# Patient Record
Sex: Male | Born: 1990 | Race: White | Hispanic: No | Marital: Single | Smoking: Never smoker
Health system: Southern US, Community
[De-identification: ages and names within clinical notes are randomized; demographics above are authoritative.]

## PROBLEM LIST (undated history)

## (undated) DIAGNOSIS — L988 Other specified disorders of the skin and subcutaneous tissue: Secondary | ICD-10-CM

## (undated) DIAGNOSIS — T7840XA Allergy, unspecified, initial encounter: Secondary | ICD-10-CM

## (undated) DIAGNOSIS — K219 Gastro-esophageal reflux disease without esophagitis: Secondary | ICD-10-CM

## (undated) HISTORY — DX: Allergy, unspecified, initial encounter: T78.40XA

---
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
## 2023-01-17 ENCOUNTER — Encounter
Admission: RE | Admit: 2023-01-17 | Discharge: 2023-01-17 | Disposition: A | Discharge status: Home / Self Care | Payer: BC Managed Care – PPO | Source: Ambulatory Visit | Attending: Surgery | Admitting: Surgery

## 2023-01-17 ENCOUNTER — Ambulatory Visit: Payer: Self-pay | Admitting: Surgery

## 2023-01-17 ENCOUNTER — Other Ambulatory Visit: Payer: Self-pay

## 2023-01-17 HISTORY — DX: Other specified disorders of the skin and subcutaneous tissue: L98.8

## 2023-01-17 HISTORY — DX: Gastro-esophageal reflux disease without esophagitis: K21.9

## 2023-01-17 NOTE — H&P (View-Only) (Signed)
Subjective:   CC: Pilonidal disease [L98.8]  HPI: Samuel Russell is a 31 y.o. male who was referred by Self Referral for evaluation of above. First noted several years ago. Recent drainage by derm. Recurrent for several months. Minimally symptomatic now.  Past Medical History: none  Past Surgical History: none  Family History: family history is not on file.  Social History: reports that he has been smoking cigarettes. He uses smokeless tobacco. Alcohol use questions deferred to the physician. He reports that he does not use drugs.  Current Medications: has a current medication list which includes the following prescription(s): fluticasone propionate and omeprazole.  Allergies:  No Known Allergies  ROS:  A 15 point review of systems was performed and pertinent positives and negatives noted in HPI  Objective:    BP (!) 150/104  Pulse 80  Ht 177.8 cm (5' 10")  Wt 99.8 kg (220 lb)  BMI 31.57 kg/m   Constitutional : No distress, cooperative, alert  Lymphatics/Throat: Supple with no lymphadenopathy  Respiratory: Clear to auscultation bilaterally  Cardiovascular: Regular rate and rhythm  Gastrointestinal: Soft, non-tender, non-distended, no organomegaly.  Musculoskeletal: Steady gait and movement  Skin: Cool and moist. Obvious pilonidal pits, chronic abscess noted on right aspect, but no obvious drainage or TTP today  Psychiatric: Normal affect, non-agitated, not confused     LABS:  N/a   RADS: N/a  Assessment:    Pilonidal disease [L98.8]  Plan:    1. Pilonidal disease [L98.8] Discussed surgical excision. Alternatives include continued observation. Benefits include possible symptom relief, pathologic evaluation, improved cosmesis. Discussed the risk of surgery including recurrence, chronic pain, post-op infxn, poor cosmesis, poor/delayed wound healing, and possible re-operation to address said risks. The risks of general anesthetic, if used, includes MI, CVA, sudden  death or even reaction to anesthetic medications also discussed.  Typical post-op recovery time of 3-5 days with possible activity restrictions were also discussed.  The patient verbalized understanding and all questions were answered to the patient's satisfaction.  2. Patient has elected to proceed with surgical treatment. Procedure will be scheduled when financially ready to do so.  labs/images/medications/previous chart entries reviewed personally and relevant changes/updates noted above.  

## 2023-01-17 NOTE — H&P (Signed)
Subjective:   CC: Pilonidal disease [L98.8]  HPI: Samuel Russell is a 32 y.o. male who was referred by Self Referral for evaluation of above. First noted several years ago. Recent drainage by derm. Recurrent for several months. Minimally symptomatic now.  Past Medical History: none  Past Surgical History: none  Family History: family history is not on file.  Social History: reports that he has been smoking cigarettes. He uses smokeless tobacco. Alcohol use questions deferred to the physician. He reports that he does not use drugs.  Current Medications: has a current medication list which includes the following prescription(s): fluticasone propionate and omeprazole.  Allergies:  No Known Allergies  ROS:  A 15 point review of systems was performed and pertinent positives and negatives noted in HPI  Objective:    BP (!) 150/104  Pulse 80  Ht 177.8 cm (5\' 10" )  Wt 99.8 kg (220 lb)  BMI 31.57 kg/m   Constitutional : No distress, cooperative, alert  Lymphatics/Throat: Supple with no lymphadenopathy  Respiratory: Clear to auscultation bilaterally  Cardiovascular: Regular rate and rhythm  Gastrointestinal: Soft, non-tender, non-distended, no organomegaly.  Musculoskeletal: Steady gait and movement  Skin: Cool and moist. Obvious pilonidal pits, chronic abscess noted on right aspect, but no obvious drainage or TTP today  Psychiatric: Normal affect, non-agitated, not confused     LABS:  N/a   RADS: N/a  Assessment:    Pilonidal disease [L98.8]  Plan:    1. Pilonidal disease [L98.8] Discussed surgical excision. Alternatives include continued observation. Benefits include possible symptom relief, pathologic evaluation, improved cosmesis. Discussed the risk of surgery including recurrence, chronic pain, post-op infxn, poor cosmesis, poor/delayed wound healing, and possible re-operation to address said risks. The risks of general anesthetic, if used, includes MI, CVA, sudden  death or even reaction to anesthetic medications also discussed.  Typical post-op recovery time of 3-5 days with possible activity restrictions were also discussed.  The patient verbalized understanding and all questions were answered to the patient's satisfaction.  2. Patient has elected to proceed with surgical treatment. Procedure will be scheduled when financially ready to do so.  labs/images/medications/previous chart entries reviewed personally and relevant changes/updates noted above.

## 2023-01-17 NOTE — Patient Instructions (Addendum)
Your procedure is scheduled on: 01/24/23 - Wednesday Report to the Registration Desk on the 1st floor of the [facility]. To find out your arrival time, please call [phone] between 1PM - 3PM on: 01/23/23 - Tuesday If your arrival time is 6:00 am, do not arrive before that time as the [facility] entrance doors do not open until 6:00 am.  REMEMBER: Instructions that are not followed completely may result in serious medical risk, up to and including death; or upon the discretion of your surgeon and anesthesiologist your surgery may need to be rescheduled.  Do not eat food after midnight the night before surgery.  No gum chewing or hard candies.  You may however, drink CLEAR liquids up to 2 hours before you are scheduled to arrive for your surgery. Do not drink anything within 2 hours of your scheduled arrival time.  Clear liquids include: - water  - apple juice without pulp - gatorade (not RED colors) - black coffee or tea (Do NOT add milk or creamers to the coffee or tea) Do NOT drink anything that is not on this list.  One week prior to surgery: Stop Anti-inflammatories (NSAIDS) such as Advil, Aleve, Ibuprofen, Motrin, Naproxen, Naprosyn and Aspirin based products such as Excedrin, Goody's Powder, BC Powder.  Stop ANY OVER THE COUNTER supplements until after surgery.  You may however, continue to take Tylenol if needed for pain up until the day of surgery.    TAKE ONLY THESE MEDICATIONS THE MORNING OF SURGERY WITH A SIP OF WATER:  omeprazole (PRILOSEC) - (take one the night before and one on the morning of surgery - helps to prevent nausea after surgery.)   No Alcohol for 24 hours before or after surgery.  No Smoking including e-cigarettes for 24 hours before surgery.  No chewable tobacco products for at least 6 hours before surgery.  No nicotine patches on the day of surgery.  Do not use any "recreational" drugs for at least a week (preferably 2 weeks) before your  surgery.  Please be advised that the combination of cocaine and anesthesia may have negative outcomes, up to and including death. If you test positive for cocaine, your surgery will be cancelled.  On the morning of surgery brush your teeth with toothpaste and water, you may rinse your mouth with mouthwash if you wish. Do not swallow any toothpaste or mouthwash.  Do not wear jewelry, make-up, hairpins, clips or nail polish.  Do not wear lotions, powders, or perfumes.   Do not shave body hair from the neck down 48 hours before surgery.  Contact lenses, hearing aids and dentures may not be worn into surgery.  Do not bring valuables to the hospital. [facility] is not responsible for any missing/lost belongings or valuables.   Notify your doctor if there is any change in your medical condition (cold, fever, infection).  Wear comfortable clothing (specific to your surgery type) to the hospital.  After surgery, you can help prevent lung complications by doing breathing exercises.  Take deep breaths and cough every 1-2 hours. Your doctor may order a device called an Incentive Spirometer to help you take deep breaths. When coughing or sneezing, hold a pillow firmly against your incision with both hands. This is called "splinting." Doing this helps protect your incision. It also decreases belly discomfort.  If you are being admitted to the hospital overnight, leave your suitcase in the car. After surgery it may be brought to your room.  In case of  increased patient census, it may be necessary for you, the patient, to continue your postoperative care in the Same Day Surgery department.  If you are being discharged the day of surgery, you will not be allowed to drive home. You will need a responsible individual to drive you home and stay with you for 24 hours after surgery.   If you are taking public transportation, you will need to have a responsible individual with you.  Please call the  Pre-admissions Testing Dept. at [phone] if you have any questions about these instructions.  Surgery Visitation Policy:  Patients having surgery or a procedure may have two visitors.  Children under the age of 27 must have an adult with them who is not the patient.  Inpatient Visitation:    Visiting hours are 7 a.m. to 8 p.m. Up to four visitors are allowed at one time in a patient room. The visitors may rotate out with other people during the day.  One visitor age 83 or older may stay with the patient overnight and must be in the room by 8 p.m.

## 2023-01-23 MED ORDER — ACETAMINOPHEN 500 MG PO TABS
1000.0000 mg | ORAL_TABLET | ORAL | Status: AC
  Administered 2023-01-24: 1000 mg via ORAL

## 2023-01-23 MED ORDER — CHLORHEXIDINE GLUCONATE CLOTH 2 % EX PADS
6.0000 | MEDICATED_PAD | Freq: Once | CUTANEOUS | Status: AC
  Administered 2023-01-24: 6 via TOPICAL

## 2023-01-23 MED ORDER — CHLORHEXIDINE GLUCONATE 0.12 % MT SOLN
15.0000 mL | Freq: Once | OROMUCOSAL | Status: AC
  Administered 2023-01-24: 15 mL via OROMUCOSAL

## 2023-01-23 MED ORDER — ORAL CARE MOUTH RINSE: 15.0000 mL | Freq: Once | OROMUCOSAL | Status: AC

## 2023-01-23 MED ORDER — LACTATED RINGERS IV SOLN: INTRAVENOUS | Status: DC

## 2023-01-23 MED ORDER — GABAPENTIN 300 MG PO CAPS
300.0000 mg | ORAL_CAPSULE | ORAL | Status: AC
  Administered 2023-01-24: 300 mg via ORAL

## 2023-01-23 MED ORDER — CELECOXIB 200 MG PO CAPS
200.0000 mg | ORAL_CAPSULE | ORAL | Status: AC
  Administered 2023-01-24: 200 mg via ORAL

## 2023-01-23 MED ORDER — CEFAZOLIN SODIUM-DEXTROSE 2-4 GM/100ML-% IV SOLN
2.0000 g | INTRAVENOUS | Status: AC
  Administered 2023-01-24: 2 g via INTRAVENOUS

## 2023-01-24 ENCOUNTER — Encounter: Payer: Self-pay | Admitting: Surgery

## 2023-01-24 ENCOUNTER — Ambulatory Visit
Admission: RE | Admit: 2023-01-24 | Discharge: 2023-01-24 | Disposition: A | Discharge status: Home / Self Care | Payer: BC Managed Care – PPO | Attending: Surgery | Admitting: Surgery

## 2023-01-24 ENCOUNTER — Other Ambulatory Visit: Payer: Self-pay

## 2023-01-24 ENCOUNTER — Ambulatory Visit: Payer: BC Managed Care – PPO | Admitting: Anesthesiology

## 2023-01-24 ENCOUNTER — Encounter
Admission: RE | Disposition: A | Discharge status: Home / Self Care | Payer: Self-pay | Source: Home / Self Care | Attending: Surgery

## 2023-01-24 DIAGNOSIS — L988 Other specified disorders of the skin and subcutaneous tissue: Secondary | ICD-10-CM | POA: Insufficient documentation

## 2023-01-24 DIAGNOSIS — F17210 Nicotine dependence, cigarettes, uncomplicated: Secondary | ICD-10-CM | POA: Diagnosis not present

## 2023-01-24 DIAGNOSIS — L0591 Pilonidal cyst without abscess: Secondary | ICD-10-CM | POA: Diagnosis not present

## 2023-01-24 HISTORY — PX: PILONIDAL CYST EXCISION: SHX744

## 2023-01-24 SURGERY — EXCISION, PILONIDAL CYST, EXTENSIVE
Anesthesia: General | Site: Buttocks

## 2023-01-24 MED ORDER — DEXMEDETOMIDINE HCL IN NACL 80 MCG/20ML IV SOLN
INTRAVENOUS | Status: DC | PRN
  Administered 2023-01-24: 8 ug via INTRAVENOUS
  Administered 2023-01-24: 12 ug via INTRAVENOUS

## 2023-01-24 MED ORDER — DOCUSATE SODIUM 100 MG PO CAPS: 100.0000 mg | ORAL_CAPSULE | Freq: Two times a day (BID) | ORAL | 0 refills | Status: AC | PRN

## 2023-01-24 MED ORDER — DEXMEDETOMIDINE HCL IN NACL 80 MCG/20ML IV SOLN
INTRAVENOUS | Status: AC
  Filled 2023-01-24: qty 20

## 2023-01-24 MED ORDER — CHLORHEXIDINE GLUCONATE 0.12 % MT SOLN
OROMUCOSAL | Status: AC
  Filled 2023-01-24: qty 15

## 2023-01-24 MED ORDER — DEXAMETHASONE SODIUM PHOSPHATE 10 MG/ML IJ SOLN
INTRAMUSCULAR | Status: DC | PRN
  Administered 2023-01-24: 5 mg via INTRAVENOUS

## 2023-01-24 MED ORDER — BUPIVACAINE HCL (PF) 0.5 % IJ SOLN
INTRAMUSCULAR | Status: AC
  Filled 2023-01-24: qty 30

## 2023-01-24 MED ORDER — EPINEPHRINE PF 1 MG/ML IJ SOLN
INTRAMUSCULAR | Status: AC
  Filled 2023-01-24: qty 1

## 2023-01-24 MED ORDER — MIDAZOLAM HCL 2 MG/2ML IJ SOLN
INTRAMUSCULAR | Status: AC
  Filled 2023-01-24: qty 2

## 2023-01-24 MED ORDER — FENTANYL CITRATE (PF) 100 MCG/2ML IJ SOLN
INTRAMUSCULAR | Status: AC
  Filled 2023-01-24: qty 2

## 2023-01-24 MED ORDER — LIDOCAINE HCL (CARDIAC) PF 100 MG/5ML IV SOSY
PREFILLED_SYRINGE | INTRAVENOUS | Status: DC | PRN
  Administered 2023-01-24: 100 mg via INTRAVENOUS

## 2023-01-24 MED ORDER — PROPOFOL 10 MG/ML IV BOLUS
INTRAVENOUS | Status: DC | PRN
  Administered 2023-01-24: 200 mg via INTRAVENOUS

## 2023-01-24 MED ORDER — ACETAMINOPHEN 325 MG PO TABS: 650.0000 mg | ORAL_TABLET | Freq: Three times a day (TID) | ORAL | 0 refills | Status: AC | PRN

## 2023-01-24 MED ORDER — ACETAMINOPHEN 500 MG PO TABS
ORAL_TABLET | ORAL | Status: AC
  Filled 2023-01-24: qty 2

## 2023-01-24 MED ORDER — PROPOFOL 10 MG/ML IV BOLUS
INTRAVENOUS | Status: AC
  Filled 2023-01-24: qty 20

## 2023-01-24 MED ORDER — SUGAMMADEX SODIUM 200 MG/2ML IV SOLN
INTRAVENOUS | Status: DC | PRN
  Administered 2023-01-24: 400 mg via INTRAVENOUS

## 2023-01-24 MED ORDER — BUPIVACAINE-EPINEPHRINE (PF) 0.5% -1:200000 IJ SOLN
INTRAMUSCULAR | Status: DC | PRN
  Administered 2023-01-24: 50 mL

## 2023-01-24 MED ORDER — IBUPROFEN 800 MG PO TABS: 800.0000 mg | ORAL_TABLET | Freq: Three times a day (TID) | ORAL | 0 refills | Status: AC | PRN

## 2023-01-24 MED ORDER — DROPERIDOL 2.5 MG/ML IJ SOLN: 0.6250 mg | Freq: Once | INTRAMUSCULAR | Status: DC | PRN

## 2023-01-24 MED ORDER — BUPIVACAINE LIPOSOME 1.3 % IJ SUSP
INTRAMUSCULAR | Status: AC
  Filled 2023-01-24: qty 20

## 2023-01-24 MED ORDER — CEFAZOLIN SODIUM-DEXTROSE 2-4 GM/100ML-% IV SOLN
INTRAVENOUS | Status: AC
  Filled 2023-01-24: qty 100

## 2023-01-24 MED ORDER — 0.9 % SODIUM CHLORIDE (POUR BTL) OPTIME
TOPICAL | Status: DC | PRN
  Administered 2023-01-24: 500 mL

## 2023-01-24 MED ORDER — OXYCODONE-ACETAMINOPHEN 5-325 MG PO TABS: 1.0000 | ORAL_TABLET | Freq: Three times a day (TID) | ORAL | 0 refills | Status: DC | PRN

## 2023-01-24 MED ORDER — FENTANYL CITRATE (PF) 100 MCG/2ML IJ SOLN: 25.0000 ug | INTRAMUSCULAR | Status: DC | PRN

## 2023-01-24 MED ORDER — MIDAZOLAM HCL 2 MG/2ML IJ SOLN
INTRAMUSCULAR | Status: DC | PRN
  Administered 2023-01-24: 2 mg via INTRAVENOUS

## 2023-01-24 MED ORDER — FENTANYL CITRATE (PF) 100 MCG/2ML IJ SOLN
INTRAMUSCULAR | Status: DC | PRN
  Administered 2023-01-24 (×2): 25 ug via INTRAVENOUS
  Administered 2023-01-24: 50 ug via INTRAVENOUS

## 2023-01-24 MED ORDER — ACETAMINOPHEN 10 MG/ML IV SOLN: 1000.0000 mg | Freq: Once | INTRAVENOUS | Status: DC | PRN

## 2023-01-24 MED ORDER — OXYCODONE HCL 5 MG PO TABS: 5.0000 mg | ORAL_TABLET | Freq: Once | ORAL | Status: DC | PRN

## 2023-01-24 MED ORDER — OXYCODONE HCL 5 MG/5ML PO SOLN: 5.0000 mg | Freq: Once | ORAL | Status: DC | PRN

## 2023-01-24 MED ORDER — CELECOXIB 200 MG PO CAPS
ORAL_CAPSULE | ORAL | Status: AC
  Filled 2023-01-24: qty 1

## 2023-01-24 MED ORDER — ONDANSETRON HCL 4 MG/2ML IJ SOLN
INTRAMUSCULAR | Status: DC | PRN
  Administered 2023-01-24: 4 mg via INTRAVENOUS

## 2023-01-24 MED ORDER — ALBUTEROL SULFATE HFA 108 (90 BASE) MCG/ACT IN AERS
INHALATION_SPRAY | RESPIRATORY_TRACT | Status: DC | PRN
  Administered 2023-01-24: 4 via RESPIRATORY_TRACT

## 2023-01-24 MED ORDER — PROMETHAZINE HCL 25 MG/ML IJ SOLN: 6.2500 mg | INTRAMUSCULAR | Status: DC | PRN

## 2023-01-24 MED ORDER — GABAPENTIN 300 MG PO CAPS
ORAL_CAPSULE | ORAL | Status: AC
  Filled 2023-01-24: qty 1

## 2023-01-24 MED ORDER — ROCURONIUM BROMIDE 100 MG/10ML IV SOLN
INTRAVENOUS | Status: DC | PRN
  Administered 2023-01-24: 70 mg via INTRAVENOUS

## 2023-01-24 SURGICAL SUPPLY — 36 items
ADH LQ OCL WTPRF AMP STRL LF (MISCELLANEOUS) ×1
ADHESIVE MASTISOL STRL (MISCELLANEOUS) ×1 IMPLANT
BLADE CLIPPER SURG (BLADE) ×1 IMPLANT
BLADE SURG SZ10 CARB STEEL (BLADE) ×1 IMPLANT
BRIEF MESH DISP 2XL (UNDERPADS AND DIAPERS) ×1 IMPLANT
BRUSH SCRUB EZ 4% CHG (MISCELLANEOUS) ×1 IMPLANT
DRAPE LAPAROTOMY 100X77 ABD (DRAPES) ×1 IMPLANT
ELECT REM PT RETURN 9FT ADLT (ELECTROSURGICAL) ×1
ELECTRODE REM PT RTRN 9FT ADLT (ELECTROSURGICAL) ×1 IMPLANT
GAUZE 4X4 16PLY ~~LOC~~+RFID DBL (SPONGE) ×1 IMPLANT
GAUZE SPONGE 4X4 12PLY STRL (GAUZE/BANDAGES/DRESSINGS) ×1 IMPLANT
GLOVE BIOGEL PI IND STRL 7.0 (GLOVE) ×1 IMPLANT
GLOVE SURG SYN 6.5 ES PF (GLOVE) ×1 IMPLANT
GLOVE SURG SYN 6.5 PF PI (GLOVE) ×1 IMPLANT
GOWN STRL REUS W/ TWL LRG LVL3 (GOWN DISPOSABLE) ×2 IMPLANT
GOWN STRL REUS W/TWL LRG LVL3 (GOWN DISPOSABLE) ×2
KIT TURNOVER KIT A (KITS) ×1 IMPLANT
MANIFOLD NEPTUNE II (INSTRUMENTS) ×1 IMPLANT
NDL HYPO 22X1.5 SAFETY MO (MISCELLANEOUS) ×1 IMPLANT
NEEDLE HYPO 22X1.5 SAFETY MO (MISCELLANEOUS) ×1 IMPLANT
NS IRRIG 500ML POUR BTL (IV SOLUTION) ×1 IMPLANT
PACK BASIN MINOR ARMC (MISCELLANEOUS) ×1 IMPLANT
PAD ABD DERMACEA PRESS 5X9 (GAUZE/BANDAGES/DRESSINGS) ×1 IMPLANT
SOL PREP PVP 2OZ (MISCELLANEOUS) ×1
SOLUTION PREP PVP 2OZ (MISCELLANEOUS) ×1 IMPLANT
SUT MNCRL 4-0 (SUTURE) ×1
SUT MNCRL 4-0 27XMFL (SUTURE) ×1
SUT VIC AB 3-0 SH 27 (SUTURE) ×1
SUT VIC AB 3-0 SH 27X BRD (SUTURE) ×1 IMPLANT
SUTURE MNCRL 4-0 27XMF (SUTURE) ×1 IMPLANT
SYR 10ML LL (SYRINGE) ×1 IMPLANT
SYR 20ML LL LF (SYRINGE) ×1 IMPLANT
SYR BULB IRRIG 60ML STRL (SYRINGE) ×1 IMPLANT
TAPE CLOTH 3X10 WHT NS LF (GAUZE/BANDAGES/DRESSINGS) ×1 IMPLANT
TRAP FLUID SMOKE EVACUATOR (MISCELLANEOUS) ×1 IMPLANT
WATER STERILE IRR 500ML POUR (IV SOLUTION) ×1 IMPLANT

## 2023-01-24 NOTE — Interval H&P Note (Signed)
No change. OK to proceed.

## 2023-01-24 NOTE — Op Note (Addendum)
Pre-Op Dx: pilonidal disease Post-Op Dx: same Anesthesia: GETA EBL: 20ml Complications:  none apparent Specimen: none Procedure: pilonidal cystectomy  Surgeon: Tonna Boehringer  Indications for procedure: See H&P  Description of Procedure:  Consent obtained, time out performed.  Patient placed in prone position.  Area sterilized and draped in usual position.  After infusion of local,elliptical incision created around the diseased midline portion along with the abscess on right lateral aspect, removed entirely down to subcutaneous tissue, passed off operative field.  All visible hair and chronic irritated tissue removed out of wound bed.   Wound hemostasis noted, exparel infused, and wound packed with betadine infused kerlex roll, secured in placed with 4x4 and abd pape, medipore tape. Total wound measurement 8cm x 4cm x 3cm deep  Pt tolerated procedure well, and transferred to PACU in stable condition. Sponge and instrument count correct at end of procedure.

## 2023-01-24 NOTE — Anesthesia Preprocedure Evaluation (Signed)
Anesthesia Evaluation  Patient identified by MRN, date of birth, ID band Patient awake    Reviewed: Allergy & Precautions, H&P , NPO status , Patient's Chart, lab work & pertinent test results, reviewed documented beta blocker date and time   Airway Mallampati: II  TM Distance: >3 FB Neck ROM: full    Dental  (+) Teeth Intact   Pulmonary neg pulmonary ROS   Pulmonary exam normal        Cardiovascular Exercise Tolerance: Good negative cardio ROS Normal cardiovascular exam Rhythm:regular Rate:Normal     Neuro/Psych negative neurological ROS  negative psych ROS   GI/Hepatic Neg liver ROS,GERD  Medicated,,  Endo/Other  negative endocrine ROS    Renal/GU negative Renal ROS  negative genitourinary   Musculoskeletal   Abdominal   Peds  Hematology negative hematology ROS (+)   Anesthesia Other Findings Past Medical History: No date: GERD (gastroesophageal reflux disease) No date: Pilonidal disease Past Surgical History: 2006: FRACTURE SURGERY     Comment:  reconstructive surgery on left leg due to sport injury BMI    Body Mass Index: 32.28 kg/m     Reproductive/Obstetrics negative OB ROS                             Anesthesia Physical Anesthesia Plan  ASA: 2  Anesthesia Plan: General ETT   Post-op Pain Management:    Induction:   PONV Risk Score and Plan: 3  Airway Management Planned:   Additional Equipment:   Intra-op Plan:   Post-operative Plan:   Informed Consent: I have reviewed the patients History and Physical, chart, labs and discussed the procedure including the risks, benefits and alternatives for the proposed anesthesia with the patient or authorized representative who has indicated his/her understanding and acceptance.     Dental Advisory Given  Plan Discussed with: CRNA  Anesthesia Plan Comments:        Anesthesia Quick Evaluation

## 2023-01-24 NOTE — Discharge Instructions (Addendum)
Removal, Care After This sheet gives you information about how to care for yourself after your procedure. Your health care provider may also give you more specific instructions. If you have problems or questions, contact your health care provider. What can I expect after the procedure? After the procedure, it is common to have: Soreness. Bruising. Itching. Follow these instructions at home: site care Follow instructions from your health care provider about how to take care of your site. Make sure you: Wash your hands with soap and water before and after you change your bandage (dressing). If soap and water are not available, use hand sanitizer. Leave stitches (sutures), skin glue, or adhesive strips in place. These skin closures may need to stay in place for 2 weeks or longer. If adhesive strip edges start to loosen and curl up, you may trim the loose edges. Do not remove adhesive strips completely unless your health care provider tells you to do that. If the area bleeds or bruises, apply gentle pressure for 10 minutes. NO shower until first dressing change in office IF PACKING FALLS OUT BEFORE FIRST VISIT, PACK THE WOUND WITH GAUZE SOAKED WITH PURIFIED WATER.  Check your site every day for signs of infection. Check for: Redness, swelling, or pain. Fluid or blood. Warmth. Pus or a bad smell.  General instructions Rest and then return to your normal activities as told by your health care provider.  tylenol and advil as needed for discomfort.  Please alternate between the two every four hours as needed for pain.    Use narcotics, if prescribed, only when tylenol and motrin is not enough to control pain.  325-650mg  every 8hrs to max of 3000mg /24hrs (including the 325mg  in every norco dose) for the tylenol.    Advil up to 800mg  per dose every 8hrs as needed for pain.   Keep all follow-up visits as told by your health care provider. This is important. Contact a health care provider if: You  have redness, swelling, or pain around your site. You have fluid or blood coming from your site. Your site feels warm to the touch. You have pus or a bad smell coming from your site. You have a fever. Your sutures, skin glue, or adhesive strips loosen or come off sooner than expected. Get help right away if: You have bleeding that does not stop with pressure or a dressing. Summary After the procedure, it is common to have some soreness, bruising, and itching at the site. Follow instructions from your health care provider about how to take care of your site. Check your site every day for signs of infection. Contact a health care provider if you have redness, swelling, or pain around your site, or your site feels warm to the touch. Keep all follow-up visits as told by your health care provider. This is important. This information is not intended to replace advice given to you by your health care provider. Make sure you discuss any questions you have with your health care provider. Document Released: 08/20/2015 Document Revised: 01/21/2018 Document Reviewed: 01/21/2018 Elsevier Interactive Patient Education  2019 [employer]   AMBULATORY SURGERY  DISCHARGE INSTRUCTIONS   The drugs that you were given will stay in your system until tomorrow so for the next 24 hours you should not:  Drive an automobile Make any legal decisions Drink any alcoholic beverage   You may resume regular meals tomorrow.  Today it is better to start with liquids and gradually work up to solid foods.  You may eat anything you prefer, but it is better to start with liquids, then soup and crackers, and gradually work up to solid foods.   Please notify your doctor immediately if you have any unusual bleeding, trouble breathing, redness and pain at the surgery site, drainage, fever, or pain not relieved by medication.     Your post-operative visit with Dr.                                       is: Date:                         Time:    Please call to schedule your post-operative visit.  Additional Instructions:

## 2023-01-24 NOTE — Anesthesia Procedure Notes (Signed)
Procedure Name: Intubation Date/Time: 01/24/2023 1:54 PM  Performed by: Danelle Berry, CRNAPre-anesthesia Checklist: Patient identified, Emergency Drugs available, Suction available and Patient being monitored Patient Re-evaluated:Patient Re-evaluated prior to induction Oxygen Delivery Method: Circle system utilized Preoxygenation: Pre-oxygenation with 100% oxygen Induction Type: IV induction Ventilation: Mask ventilation without difficulty Laryngoscope Size: McGraph and 3 Grade View: Grade II Tube type: Oral Tube size: 7.5 mm Number of attempts: 2 Airway Equipment and Method: Stylet and Oral airway Placement Confirmation: ETT inserted through vocal cords under direct vision, positive ETCO2 and breath sounds checked- equal and bilateral Secured at: 22 cm Tube secured with: Tape Dental Injury: Teeth and Oropharynx as per pre-operative assessment

## 2023-01-24 NOTE — Transfer of Care (Signed)
Immediate Anesthesia Transfer of Care Note  Patient: Samuel Russell  Procedure(s) Performed: CYST EXCISION PILONIDAL EXTENSIVE (Buttocks)  Patient Location: PACU  Anesthesia Type:General  Level of Consciousness: drowsy  Airway & Oxygen Therapy: Patient Spontanous Breathing and Patient connected to face mask oxygen  Post-op Assessment: Report given to RN and Post -op Vital signs reviewed and stable  Post vital signs: Reviewed and stable  Last Vitals:  Vitals Value Taken Time  BP 123/79 01/24/23 1345  Temp 36.2 C 01/24/23 1345  Pulse 78 01/24/23 1347  Resp 15 01/24/23 1347  SpO2 98 % 01/24/23 1347  Vitals shown include unvalidated device data.  Last Pain:  Vitals:   01/24/23 0850  TempSrc: Temporal  PainSc: 0-No pain         Complications: No notable events documented.

## 2023-01-25 ENCOUNTER — Encounter: Payer: Self-pay | Admitting: Surgery

## 2023-01-26 NOTE — Anesthesia Postprocedure Evaluation (Signed)
Anesthesia Post Note  Patient: Samuel Russell  Procedure(s) Performed: CYST EXCISION PILONIDAL EXTENSIVE (Buttocks)  Patient location during evaluation: PACU Anesthesia Type: General Level of consciousness: awake and alert Pain management: pain level controlled Vital Signs Assessment: post-procedure vital signs reviewed and stable Respiratory status: spontaneous breathing, nonlabored ventilation, respiratory function stable and patient connected to nasal cannula oxygen Cardiovascular status: blood pressure returned to baseline and stable Postop Assessment: no apparent nausea or vomiting Anesthetic complications: no   No notable events documented.   Last Vitals:  Vitals:   01/24/23 1430 01/24/23 1448  BP: 113/65 (!) 135/95  Pulse: 67 64  Resp: 13 14  Temp: (!) 36.1 C (!) 36.2 C  SpO2: 95% 97%    Last Pain:  Vitals:   01/24/23 1448  TempSrc: Temporal  PainSc: 0-No pain                 Yevette Edwards

## 2023-02-02 ENCOUNTER — Encounter: Payer: Self-pay | Admitting: Emergency Medicine

## 2023-02-02 ENCOUNTER — Other Ambulatory Visit: Payer: Self-pay

## 2023-02-02 DIAGNOSIS — Z4801 Encounter for change or removal of surgical wound dressing: Secondary | ICD-10-CM | POA: Insufficient documentation

## 2023-02-02 NOTE — ED Triage Notes (Addendum)
Patient had pilonidal cyst removed on 6/19.  Patient changed dressing tonight and noticed that the incision site was bleeding and wouldn't stop.  Patient reports it has bleed through 3 dressings and a pair of shorts. Looking under current dressing, bleeding appears to be controlled at this time.

## 2023-02-03 ENCOUNTER — Emergency Department
Admission: EM | Admit: 2023-02-03 | Discharge: 2023-02-03 | Disposition: A | Discharge status: Home / Self Care | Payer: BC Managed Care – PPO | Attending: Emergency Medicine | Admitting: Emergency Medicine

## 2023-02-03 DIAGNOSIS — Z5189 Encounter for other specified aftercare: Secondary | ICD-10-CM

## 2023-02-03 NOTE — ED Provider Notes (Signed)
[facility]-Main Provider Note    Event Date/Time   First MD Initiated Contact with Patient 02/03/23 0036     (approximate)   History   Post-op Problem   HPI  Samuel Russell is a 32 y.o. male who presents to the ED for evaluation of Post-op Problem   Review 6/19 operative note from outpatient pilonidal cyst excision, uncomplicated.  Patient presents to the ED alongside his girlfriend and her mother for evaluation of bleeding wound.  They have been doing wet-to-dry dressings over this area without issue until this evening.  Changing dressings every night.  Girlfriend removed the underlying base dressing and reports she had to "pull little bit."  Bleeding profusely since then.  No purulence, erythema surrounding the fevers or systemic symptoms.  No direct traumas   Physical Exam   Triage Vital Signs: ED Triage Vitals [02/02/23 2307]  Enc Vitals Group     BP (!) 167/113     Pulse Rate (!) 102     Resp 18     Temp 97.7 F (36.5 C)     Temp Source Oral     SpO2 97 %     Weight 224 lb 13.9 oz (102 kg)     Height 5\' 10"  (1.778 m)     Head Circumference      Peak Flow      Pain Score 0     Pain Loc      Pain Edu?      Excl. in GC?     Most recent vital signs: Vitals:   02/02/23 2307  BP: (!) 167/113  Pulse: (!) 102  Resp: 18  Temp: 97.7 F (36.5 C)  SpO2: 97%    General: Awake, no distress.  Prone in a gown, pleasant conversational. CV:  Good peripheral perfusion.  Resp:  Normal effort.  Abd:  No distention.  MSK:  No deformity noted.  Neuro:  No focal deficits appreciated. Other:  Blood saturated gauze to the excision site I gently remove these, clots to the wound bed and hemostatic.  No areas of bleeding or oozing identified   ED Results / Procedures / Treatments   Labs (all labs ordered are listed, but only abnormal results are displayed) Labs Reviewed - No data to display  EKG   RADIOLOGY   Official radiology report(s): No  results found.  PROCEDURES and INTERVENTIONS:  Procedures  Medications - No data to display   IMPRESSION / MDM / ASSESSMENT AND PLAN / ED COURSE  I reviewed the triage vital signs and the nursing notes.  Differential diagnosis includes, but is not limited to, postop infection, abscess, clot disruption   Patient presents with bleeding wound bed from a previous pilonidal cystectomy.  Hemostatic by the time I evaluated.  No signs or symptoms of symptomatic anemia or indications to check a serum hemoglobin.  I inspect the wound, reassure family and educate the girlfriend of appropriate packing and dressing.  I provided them with supplies to go home with tonight.  We discussed return precautions and surgical follow-up.  Suitable for outpatient management.      FINAL CLINICAL IMPRESSION(S) / ED DIAGNOSES   Final diagnoses:  Visit for wound check     Rx / DC Orders   ED Discharge Orders     None        Note:  This document was prepared using Dragon voice recognition software and may include unintentional dictation errors.   Delton [place],  MD 02/03/23 1610

## 2023-02-03 NOTE — ED Notes (Signed)
Patient noted to first nurse that he had already bled through 2 abd pads that were given to him approx 1 hour ago.

## 2023-09-11 ENCOUNTER — Other Ambulatory Visit: Payer: Self-pay

## 2023-09-11 ENCOUNTER — Emergency Department: Payer: BC Managed Care – PPO

## 2023-09-11 ENCOUNTER — Emergency Department
Admission: EM | Admit: 2023-09-11 | Discharge: 2023-09-11 | Disposition: A | Discharge status: Home / Self Care | Payer: BC Managed Care – PPO | Attending: Emergency Medicine | Admitting: Emergency Medicine

## 2023-09-11 DIAGNOSIS — R509 Fever, unspecified: Secondary | ICD-10-CM | POA: Diagnosis not present

## 2023-09-11 DIAGNOSIS — J020 Streptococcal pharyngitis: Secondary | ICD-10-CM | POA: Diagnosis not present

## 2023-09-11 DIAGNOSIS — J101 Influenza due to other identified influenza virus with other respiratory manifestations: Secondary | ICD-10-CM

## 2023-09-11 DIAGNOSIS — R059 Cough, unspecified: Secondary | ICD-10-CM | POA: Diagnosis not present

## 2023-09-11 DIAGNOSIS — Z20822 Contact with and (suspected) exposure to covid-19: Secondary | ICD-10-CM | POA: Diagnosis not present

## 2023-09-11 DIAGNOSIS — R0902 Hypoxemia: Secondary | ICD-10-CM | POA: Diagnosis not present

## 2023-09-11 LAB — RESP PANEL BY RT-PCR (RSV, FLU A&B, COVID)  RVPGX2
Influenza A by PCR: POSITIVE — AB
Influenza B by PCR: NEGATIVE
Resp Syncytial Virus by PCR: NEGATIVE
SARS Coronavirus 2 by RT PCR: NEGATIVE

## 2023-09-11 LAB — GROUP A STREP BY PCR: Group A Strep by PCR: DETECTED — AB

## 2023-09-11 MED ORDER — PSEUDOEPH-BROMPHEN-DM 30-2-10 MG/5ML PO SYRP: 5.0000 mL | ORAL_SOLUTION | Freq: Four times a day (QID) | ORAL | 0 refills | Status: DC | PRN

## 2023-09-11 MED ORDER — AMOXICILLIN 875 MG PO TABS: 875.0000 mg | ORAL_TABLET | Freq: Two times a day (BID) | ORAL | 0 refills | Status: DC

## 2023-09-11 NOTE — ED Triage Notes (Signed)
Pt sent by [facility] for SpO2 88% that improved to 93% on oxygen 2lpm via [place]. Pt presented to [facility] for flu like sx.

## 2023-09-11 NOTE — ED Notes (Signed)
Discussed bp elevation both readings with patient.  He does nothave a pcp and is not interested in that.  I explained that he should keep an eye on it and could even go to urgent care if needed.

## 2023-09-11 NOTE — ED Provider Notes (Signed)
 [facility] Provider Note    Event Date/Time   First MD Initiated Contact with Patient 09/11/23 1431     (approximate)   History   Fever and Cough   HPI  Samuel Russell is a 33 y.o. male with history of GERD presents emergency department complaint of flulike symptoms since Saturday.  Fever, sore throat, congestion and headache.  Temp was 104 this morning.  Sent here by [facility] as they noted his O2 saturation was 88% room air, here in the ED was 92% on room air.  Nuys chest pain or shortness of breath      Physical Exam   Triage Vital Signs: ED Triage Vitals  Encounter Vitals Group     BP 09/11/23 1151 (!) 147/107     Systolic BP Percentile --      Diastolic BP Percentile --      Pulse Rate 09/11/23 1151 (!) 116     Resp 09/11/23 1151 20     Temp 09/11/23 1151 (!) 100.6 F (38.1 C)     Temp Source 09/11/23 1151 Oral     SpO2 09/11/23 1151 92 %     Weight 09/11/23 1153 213 lb (96.6 kg)     Height 09/11/23 1153 5' 10 (1.778 m)     Head Circumference --      Peak Flow --      Pain Score 09/11/23 1153 7     Pain Loc --      Pain Education --      Exclude from Growth Chart --     Most recent vital signs: Vitals:   09/11/23 1151  BP: (!) 147/107  Pulse: (!) 116  Resp: 20  Temp: (!) 100.6 F (38.1 C)  SpO2: 92%     General: Awake, no distress.   CV:  Good peripheral perfusion. regular rate and  rhythm Resp:  Normal effort. Lungs CTA Abd:  No distention.   Other:      ED Results / Procedures / Treatments   Labs (all labs ordered are listed, but only abnormal results are displayed) Labs Reviewed  GROUP A STREP BY PCR - Abnormal; Notable for the following components:      Result Value   Group A Strep by PCR DETECTED (*)    All other components within normal limits  RESP PANEL BY RT-PCR (RSV, FLU A&B, COVID)  RVPGX2 - Abnormal; Notable for the following components:   Influenza A by PCR POSITIVE (*)    All other components within  normal limits     EKG     RADIOLOGY Chest x-ray    PROCEDURES:   Procedures Chief Complaint  Patient presents with   Fever   Cough      MEDICATIONS ORDERED IN ED: Medications - No data to display   IMPRESSION / MDM / ASSESSMENT AND PLAN / ED COURSE  I reviewed the triage vital signs and the nursing notes.                              Differential diagnosis includes, but is not limited to, influenza, COVID, RSV, strep throat, viral illness  Patient's presentation is most consistent with acute illness / injury with system symptoms.   Chest x-ray independently reviewed interpreted by me as being negative for any acute abnormality  Respiratory panel is positive for influenza A  Strep test is also positive  I did explain  this to the patient.  He is to take Tylenol  and ibuprofen  every 4 hours for fever.  Gargle with warm salt water.  Discard his toothbrush in 3 days.  He is given prescription for amoxicillin .  Tamiflu not prescribed as he is out of the 48-hour window.  He is in agreement treatment plan.  Given a work note discharged stable condition with strict instructions to return if worsening      FINAL CLINICAL IMPRESSION(S) / ED DIAGNOSES   Final diagnoses:  Influenza A  Acute streptococcal pharyngitis     Rx / DC Orders   ED Discharge Orders          Ordered    amoxicillin  (AMOXIL ) 875 MG tablet  2 times daily        09/11/23 1429    brompheniramine-pseudoephedrine-DM 30-2-10 MG/5ML syrup  4 times daily PRN        09/11/23 1429             Note:  This document was prepared using Dragon voice recognition software and may include unintentional dictation errors.    Gasper Devere ORN, PA-C 09/11/23 1434    Arlander Charleston, MD 09/11/23 [phone]

## 2023-09-11 NOTE — ED Provider Triage Note (Signed)
 Emergency Medicine Provider Triage Evaluation Note  Samuel Russell , a 33 y.o. male  was evaluated in triage.  Pt complains of fever and cough.  Flu sx started 3 days ago. Was sent by [facility] for hypoxia.  O2 at [facility] 88%.  Pt vapes.    Review of Systems  Positive: Fever, s/t, congestion Negative: No vomiting or diarrhea.   Physical Exam  BP (!) 147/107 (BP Location: Right Arm)   Pulse (!) 116   Temp (!) 100.6 F (38.1 C) (Oral)   Resp 20   Ht 5' 10 (1.778 m)   Wt 96.6 kg   SpO2 92%   BMI 30.56 kg/m  Gen:   Awake, no distress   Talkative without difficulty.  Non-toxic Resp:  Normal effort  Lungs clear bilat.  MSK:   Moves extremities without difficulty  Other:    Medical Decision Making  Medically screening exam initiated at 12:00 PM.  Appropriate orders placed.  Dorise LITTIE Daring was informed that the remainder of the evaluation will be completed by another provider, this initial triage assessment does not replace that evaluation, and the importance of remaining in the ED until their evaluation is complete.     Saunders Shona LITTIE, PA-C 09/11/23 [phone]

## 2023-09-11 NOTE — Discharge Instructions (Addendum)
Take Tylenol and ibuprofen for fever as needed. Discard your toothbrush in 3 days so you will not reinfect yourself

## 2023-09-11 NOTE — ED Triage Notes (Signed)
 Pt to ED for flulike symptoms since Saturday night. Fever, sore throat, congestion, HA. States temp was 104 this morning. Temp in triage is 100.6, took tylenol  4 hour sago. Is 92-95%% on room air, [facility] staff had placed pt on 2L but pt is not hypoxic at this time on RA so tank turned off.

## 2023-10-29 ENCOUNTER — Encounter: Payer: Self-pay | Admitting: Physician Assistant

## 2023-10-29 ENCOUNTER — Ambulatory Visit: Payer: BC Managed Care – PPO | Admitting: Physician Assistant

## 2023-10-29 ENCOUNTER — Ambulatory Visit
Admission: RE | Admit: 2023-10-29 | Discharge: 2023-10-29 | Disposition: A | Discharge status: Home / Self Care | Source: Ambulatory Visit | Attending: Physician Assistant | Admitting: Physician Assistant

## 2023-10-29 VITALS — BP 146/100 | HR 88 | Temp 98.1°F | Ht 70.0 in | Wt 220.0 lb

## 2023-10-29 DIAGNOSIS — N5089 Other specified disorders of the male genital organs: Secondary | ICD-10-CM | POA: Insufficient documentation

## 2023-10-29 DIAGNOSIS — I10 Essential (primary) hypertension: Secondary | ICD-10-CM | POA: Diagnosis not present

## 2023-10-29 DIAGNOSIS — N433 Hydrocele, unspecified: Secondary | ICD-10-CM | POA: Diagnosis not present

## 2023-10-29 DIAGNOSIS — Z720 Tobacco use: Secondary | ICD-10-CM | POA: Diagnosis not present

## 2023-10-29 NOTE — Progress Notes (Signed)
 Date:  10/29/2023   Name:  Samuel Russell   DOB:  01-22-1991   MRN:  [id number]   Chief Complaint: Establish Care, Back Pain, Family Problem (Brother just diagnosed with MS), and Testicle Pain  Back Pain This is a chronic problem. Episode onset: X18 months. Episode frequency: comes and goes. The problem is unchanged. The pain is present in the lumbar spine. The quality of the pain is described as aching and shooting. The pain radiates to the right thigh and left thigh. The pain is at a severity of 4/10. The pain is mild. The pain is Worse during the night. The symptoms are aggravated by sitting and lying down. Stiffness is present At night. He has tried heat and NSAIDs for the symptoms. The treatment provided mild relief.  Testicle Pain The patient's primary symptoms include testicular pain. This is a new problem. Episode onset: X6 months. Episode frequency: comes and goes. The problem has been unchanged. The pain is mild. The testicular pain affects the left testicle. There is swelling in the left testicle. The color of the testicles is Normal. He has tried nothing for the symptoms. He is sexually active. He never uses condoms. It is unknown whether or not his partner has an STD.    Samuel Russell is a very pleasant 33 year old male who has never seen a primary care provider, new to the clinic today to establish care.    He has had several elevated blood pressure readings at various outpatient visits but has never taken medication for this problem, was always told to follow-up with PCP.  He does not monitor blood pressure at home.  He has had a large painless left testicular mass for at least the last 6 months, though the timeline is not well-established.  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  Uses ibuprofen as needed for this.  Brother recently diagnosed with MS following a workup for neuropathy and paresthesias ultimately leading to brain  imaging with characteristic findings.  Patient states he does not have a favorable family history for chronic diseases either, reporting several members of the family with hypertension, diabetes, hyperlipidemia, heart disease.   Medication list has been reviewed and updated.  Current Meds  Medication Sig   fluticasone (FLONASE) 50 MCG/ACT nasal spray Place 1 spray into both nostrils as needed for allergies or rhinitis.   ibuprofen (ADVIL) 800 MG tablet Take 1 tablet (800 mg total) by mouth every 8 (eight) hours as needed for mild pain or moderate pain.   omeprazole (PRILOSEC) 20 MG capsule Take 20 mg by mouth daily.   [DISCONTINUED] amoxicillin (AMOXIL) 875 MG tablet Take 1 tablet (875 mg total) by mouth 2 (two) times daily.     Review of Systems  Genitourinary:  Positive for testicular pain.  Musculoskeletal:  Positive for back pain.    Patient Active Problem List   Diagnosis Date Noted   Testicular mass 10/29/2023   Primary hypertension 10/29/2023   Vapes nicotine containing substance 10/29/2023    No Known Allergies   There is no immunization history on file for this patient.  Past Surgical History:  Procedure Laterality Date   FRACTURE SURGERY  2006   reconstructive surgery on left leg due to sport injury   PILONIDAL CYST EXCISION N/A 01/24/2023   Procedure: CYST EXCISION PILONIDAL EXTENSIVE;  Surgeon: Sung Amabile, DO;  Location: [facility] ORS;  Service: General;  Laterality: N/A;  Social History   Tobacco Use   Smoking status: Never   Smokeless tobacco: Never  Vaping Use   Vaping status: Every Day   Substances: Nicotine, Flavoring  Substance Use Topics   Alcohol use: Never   Drug use: Never    Family History  Problem Relation Age of Onset   Diabetes Mother    Multiple sclerosis Brother    Hypertension Maternal Grandmother    Heart disease Maternal Grandmother    Diabetes Maternal Grandmother    Cancer Maternal Grandmother    Hypertension Maternal  Grandfather    Heart disease Maternal Grandfather    Diabetes Maternal Grandfather    Hypertension Paternal Grandmother    Hypertension Paternal Grandfather         10/29/2023    3:06 PM  GAD 7 : Generalized Anxiety Score  Nervous, Anxious, on Edge 1  Control/stop worrying 0  Worry too much - different things 1  Trouble relaxing 0  Restless 0  Easily annoyed or irritable 1  Afraid - awful might happen 0  Total GAD 7 Score 3  Anxiety Difficulty Not difficult at all       10/29/2023    3:06 PM  Depression screen PHQ 2/9  Decreased Interest 0  Down, Depressed, Hopeless 0  PHQ - 2 Score 0    BP Readings from Last 3 Encounters:  10/29/23 (!) 146/100  09/11/23 (!) 138/96  02/03/23 (!) 150/79    Wt Readings from Last 3 Encounters:  10/29/23 220 lb (99.8 kg)  09/11/23 213 lb (96.6 kg)  02/02/23 224 lb 13.9 oz (102 kg)    BP (!) 146/100 (Cuff Size: Large)   Pulse 88   Temp 98.1 F (36.7 C)   Ht 5\' 10"  (1.778 m)   Wt 220 lb (99.8 kg)   SpO2 95%   BMI 31.57 kg/m   Physical Exam Vitals and nursing note reviewed.  Constitutional:      Appearance: Normal appearance.  Cardiovascular:     Rate and Rhythm: Normal rate and regular rhythm.     Heart sounds: No murmur heard.    No friction rub. No gallop.  Pulmonary:     Effort: Pulmonary effort is normal.     Breath sounds: Normal breath sounds.  Abdominal:     General: There is no distension.  Genitourinary:    Testes:        Left: Mass present.     Comments: Very large, firm, but smooth left testicular mass roughly 5-6 cm in length. Painless. No varicocele appreciated.  Musculoskeletal:        General: Normal range of motion.  Skin:    General: Skin is warm and dry.  Neurological:     Mental Status: He is alert and oriented to person, place, and time.     Gait: Gait is intact.  Psychiatric:        Mood and Affect: Mood and affect normal.     Recent Labs  No results found for: "NA", "K", "CL", "CO2",  "GLUCOSE", "BUN", "CREATININE", "CALCIUM", "PROT", "ALBUMIN", "AST", "ALT", "ALKPHOS", "BILITOT", "GFRNONAA", "GFRAA"  No results found for: "WBC", "HGB", "HCT", "MCV", "PLT" No results found for: "HGBA1C" No results found for: "CHOL", "HDL", "LDLCALC", "LDLDIRECT", "TRIG", "CHOLHDL" No results found for: "TSH"   Assessment and Plan:  Testicular mass Assessment & Plan: Discussed with patient he does have a highly concerning testicular mass which is quite large.  Ordering stat ultrasound of scrotum with Doppler to further evaluate.  Patient advised that this needs to be done as soon as possible and should be priority #1. Patient prepared for the possibility of malignancy but advised that we will take this one step at a time.   Orders: -     US SCROTUM W/DOPPLER; Future  Primary hypertension Assessment & Plan: BP elevated in clinic x2. Patient suspects he may have white coat HTN and is agreeable to purchasing home BP cuff for monitoring. Will return in about 2 weeks for recheck, with a log for my review. Hold off on pharmacotherapy for now.   Reviewed the "Seven S's" of hypertension including salt, smoking, stimulants (e.g. caffeine), stress, sleep, snoring (OSA), sedentary lifestyle.   Orders: -     CBC with Differential/Platelet -     Comprehensive metabolic panel  Vapes nicotine containing substance Assessment & Plan: Encouraged cessation. Discussed nicotine's effect on HTN.       Return in about 2 weeks (around 11/12/2023) for OV f/u chronic conditions.   Estimated provider time of 47 minutes inclusive of history taking, physical exam with abnormal findings, STAT imaging, ordering of labs, and patient education/counseling.  Alvester Morin, PA-C, DMSc, Nutritionist [facility] [phone]

## 2023-10-29 NOTE — Patient Instructions (Addendum)
 Reviewed the "Seven S's" of hypertension including salt, smoking, stimulants (e.g. caffeine), stress, sleep, snoring (OSA), sedentary lifestyle.  -It was a pleasure to see you today! Please review your visit summary for helpful information -Lab results are usually available within 1-2 days and we will call once reviewed -I would encourage you to follow your care via MyChart where you can access lab results, notes, messages, and more -If you feel that we did a nice job today, please complete your after-visit survey and leave [place] a Google review! Your CMA today was Mariann Barter and your provider was Alvester Morin, PA-C, DMSc -Please return for follow-up in about 2 weeks.

## 2023-10-29 NOTE — Assessment & Plan Note (Addendum)
 Encouraged cessation. Discussed nicotine's effect on HTN.

## 2023-10-29 NOTE — Assessment & Plan Note (Signed)
 BP elevated in clinic x2. Patient suspects he may have white coat HTN and is agreeable to purchasing home BP cuff for monitoring. Will return in about 2 weeks for recheck, with a log for my review. Hold off on pharmacotherapy for now.   Reviewed the "Seven S's" of hypertension including salt, smoking, stimulants (e.g. caffeine), stress, sleep, snoring (OSA), sedentary lifestyle.

## 2023-10-29 NOTE — Assessment & Plan Note (Signed)
 Discussed with patient he does have a highly concerning testicular mass which is quite large.  Ordering stat ultrasound of scrotum with Doppler to further evaluate.  Patient advised that this needs to be done as soon as possible and should be priority #1. Patient prepared for the possibility of malignancy but advised that we will take this one step at a time.

## 2023-10-30 ENCOUNTER — Encounter: Payer: Self-pay | Admitting: Physician Assistant

## 2023-10-30 DIAGNOSIS — Z820 Family history of epilepsy and other diseases of the nervous system: Secondary | ICD-10-CM | POA: Insufficient documentation

## 2023-10-30 LAB — CBC WITH DIFFERENTIAL/PLATELET
Basophils Absolute: 0.1 10*3/uL (ref 0.0–0.2)
Basos: 1 %
EOS (ABSOLUTE): 0.4 10*3/uL (ref 0.0–0.4)
Eos: 6 %
Hematocrit: 48.5 % (ref 37.5–51.0)
Hemoglobin: 16.1 g/dL (ref 13.0–17.7)
Immature Grans (Abs): 0 10*3/uL (ref 0.0–0.1)
Immature Granulocytes: 0 %
Lymphocytes Absolute: 2.6 10*3/uL (ref 0.7–3.1)
Lymphs: 36 %
MCH: 28.8 pg (ref 26.6–33.0)
MCHC: 33.2 g/dL (ref 31.5–35.7)
MCV: 87 fL (ref 79–97)
Monocytes Absolute: 0.7 10*3/uL (ref 0.1–0.9)
Monocytes: 10 %
Neutrophils Absolute: 3.5 10*3/uL (ref 1.4–7.0)
Neutrophils: 47 %
Platelets: 307 10*3/uL (ref 150–450)
RBC: 5.59 x10E6/uL (ref 4.14–5.80)
RDW: 13.2 % (ref 11.6–15.4)
WBC: 7.3 10*3/uL (ref 3.4–10.8)

## 2023-10-30 LAB — COMPREHENSIVE METABOLIC PANEL
ALT: 28 IU/L (ref 0–44)
AST: 28 IU/L (ref 0–40)
Albumin: 4.8 g/dL (ref 4.1–5.1)
Alkaline Phosphatase: 108 IU/L (ref 44–121)
BUN/Creatinine Ratio: 13 (ref 9–20)
BUN: 13 mg/dL (ref 6–20)
Bilirubin Total: 0.3 mg/dL (ref 0.0–1.2)
CO2: 24 mmol/L (ref 20–29)
Calcium: 9.7 mg/dL (ref 8.7–10.2)
Chloride: 102 mmol/L (ref 96–106)
Creatinine, Ser: 1.02 mg/dL (ref 0.76–1.27)
Globulin, Total: 2.8 g/dL (ref 1.5–4.5)
Glucose: 95 mg/dL (ref 70–99)
Potassium: 4.3 mmol/L (ref 3.5–5.2)
Sodium: 140 mmol/L (ref 134–144)
Total Protein: 7.6 g/dL (ref 6.0–8.5)
eGFR: 99 mL/min/{1.73_m2} (ref >59)

## 2023-11-14 ENCOUNTER — Ambulatory Visit: Admitting: Physician Assistant

## 2023-11-14 ENCOUNTER — Encounter: Payer: Self-pay | Admitting: Physician Assistant

## 2023-11-14 VITALS — BP 132/100 | HR 69 | Temp 97.6°F | Ht 70.0 in | Wt 220.0 lb

## 2023-11-14 DIAGNOSIS — I10 Essential (primary) hypertension: Secondary | ICD-10-CM | POA: Diagnosis not present

## 2023-11-14 DIAGNOSIS — N433 Hydrocele, unspecified: Secondary | ICD-10-CM

## 2023-11-14 MED ORDER — VALSARTAN 80 MG PO TABS
80.0000 mg | ORAL_TABLET | Freq: Every day | ORAL | 1 refills | Status: AC
Start: 2023-11-14 — End: ?

## 2023-11-14 NOTE — Patient Instructions (Signed)
 Remember the "Seven S's" of hypertension including salt, smoking, stimulants (e.g. caffeine), stress, sleep, snoring (OSA), sedentary lifestyle.

## 2023-11-14 NOTE — Assessment & Plan Note (Addendum)
 Patient agreeable to initiation of antihypertensive therapy with valsartan 80 mg daily.   Reviewed the "Seven S's" of hypertension including salt, smoking, stimulants (e.g. caffeine), stress, sleep, snoring (OSA), sedentary lifestyle.  Specifically, he would benefit from nicotine cessation and reduction of energy drink consumption.

## 2023-11-14 NOTE — Progress Notes (Signed)
 Date:  11/14/2023   Name:  Samuel Russell   DOB:  06/02/1991   MRN:  [id number]   Chief Complaint: Medical Management of Chronic Issues and Hypertension (High BP readings at home )  HPI Samuel Russell presents for 2 weeks follow up, primarily for HTN. Last visit we decided not to initiate pharmacotherapy, opting instead to pursue home BP monitoring and to revisit this issue today. BP remains high at home, 130-140's systolic, 90's diastolic, using an Omron cuff. Current nicotine use via vape, also drinks an energy drink (Monster) every morning.   Last visit he did have a large concerning left scrotal mass, which stat ultrasound fortunately revealed to be moderate left hydrocele.  Patient relieved by this and elected not to pursue further treatment for this.   Medication list has been reviewed and updated.  Current Meds  Medication Sig   fluticasone (FLONASE) 50 MCG/ACT nasal spray Place 1 spray into both nostrils as needed for allergies or rhinitis.   ibuprofen (ADVIL) 800 MG tablet Take 1 tablet (800 mg total) by mouth every 8 (eight) hours as needed for mild pain or moderate pain.   omeprazole (PRILOSEC) 20 MG capsule Take 20 mg by mouth daily.   valsartan (DIOVAN) 80 MG tablet Take 1 tablet (80 mg total) by mouth daily.     Review of Systems  Patient Active Problem List   Diagnosis Date Noted   Family history of multiple sclerosis 10/30/2023   Left hydrocele, moderate 10/29/2023   Primary hypertension 10/29/2023   Vapes nicotine containing substance 10/29/2023    No Known Allergies   There is no immunization history on file for this patient.  Past Surgical History:  Procedure Laterality Date   FRACTURE SURGERY  2006   reconstructive surgery on left leg due to sport injury   PILONIDAL CYST EXCISION N/A 01/24/2023   Procedure: CYST EXCISION PILONIDAL EXTENSIVE;  Surgeon: Samuel Amabile, DO;  Location: [facility] ORS;  Service: General;  Laterality: N/A;    Social History   Tobacco Use    Smoking status: Never   Smokeless tobacco: Never  Vaping Use   Vaping status: Every Day   Substances: Nicotine, Flavoring  Substance Use Topics   Alcohol use: Never   Drug use: Never    Family History  Problem Relation Age of Onset   Diabetes Mother    Multiple sclerosis Brother    Hypertension Maternal Grandmother    Heart disease Maternal Grandmother    Diabetes Maternal Grandmother    Cancer Maternal Grandmother    Hypertension Maternal Grandfather    Heart disease Maternal Grandfather    Diabetes Maternal Grandfather    Hypertension Paternal Grandmother    Hypertension Paternal Grandfather         11/14/2023    3:13 PM 10/29/2023    3:06 PM  GAD 7 : Generalized Anxiety Score  Nervous, Anxious, on Edge 1 1  Control/stop worrying 0 0  Worry too much - different things 0 1  Trouble relaxing 0 0  Restless 0 0  Easily annoyed or irritable 1 1  Afraid - awful might happen 0 0  Total GAD 7 Score 2 3  Anxiety Difficulty Not difficult at all Not difficult at all       11/14/2023    3:12 PM 10/29/2023    3:06 PM  Depression screen PHQ 2/9  Decreased Interest 0 0  Down, Depressed, Hopeless 0 0  PHQ - 2 Score 0 0  BP Readings from Last 3 Encounters:  11/14/23 (!) 132/100  10/29/23 (!) 146/100  09/11/23 (!) 138/96    Wt Readings from Last 3 Encounters:  11/14/23 220 lb (99.8 kg)  10/29/23 220 lb (99.8 kg)  09/11/23 213 lb (96.6 kg)    BP (!) 132/100   Pulse 69   Temp 97.6 F (36.4 C)   Ht 5\' 10"  (1.778 m)   Wt 220 lb (99.8 kg)   SpO2 95%   BMI 31.57 kg/m   Physical Exam Vitals and nursing note reviewed.  Constitutional:      Appearance: Normal appearance.  Cardiovascular:     Rate and Rhythm: Normal rate.  Pulmonary:     Effort: Pulmonary effort is normal.  Abdominal:     General: There is no distension.  Musculoskeletal:        General: Normal range of motion.  Skin:    General: Skin is warm and dry.  Neurological:     Mental Status: He  is alert and oriented to person, place, and time.     Gait: Gait is intact.  Psychiatric:        Mood and Affect: Mood and affect normal.     Recent Labs     Component Value Date/Time   NA 140 10/29/2023 1554   K 4.3 10/29/2023 1554   CL 102 10/29/2023 1554   CO2 24 10/29/2023 1554   GLUCOSE 95 10/29/2023 1554   BUN 13 10/29/2023 1554   CREATININE 1.02 10/29/2023 1554   CALCIUM 9.7 10/29/2023 1554   PROT 7.6 10/29/2023 1554   ALBUMIN 4.8 10/29/2023 1554   AST 28 10/29/2023 1554   ALT 28 10/29/2023 1554   ALKPHOS 108 10/29/2023 1554   BILITOT 0.3 10/29/2023 1554    Lab Results  Component Value Date   WBC 7.3 10/29/2023   HGB 16.1 10/29/2023   HCT 48.5 10/29/2023   MCV 87 10/29/2023   PLT 307 10/29/2023   No results found for: "HGBA1C" No results found for: "CHOL", "HDL", "LDLCALC", "LDLDIRECT", "TRIG", "CHOLHDL" No results found for: "TSH"   Assessment and Plan:  Primary hypertension Assessment & Plan: Patient agreeable to initiation of antihypertensive therapy with valsartan 80 mg daily.   Reviewed the "Seven S's" of hypertension including salt, smoking, stimulants (e.g. caffeine), stress, sleep, snoring (OSA), sedentary lifestyle.  Specifically, he would benefit from nicotine cessation and reduction of energy drink consumption.   Orders: -     Valsartan; Take 1 tablet (80 mg total) by mouth daily.  Dispense: 30 tablet; Refill: 1  Left hydrocele, moderate Assessment & Plan: Patient would like to continue monitoring for now. Advised he may reach out at any time for a urology referral.       Return in about 4 weeks (around 12/12/2023) for OV f/u HTN.    Alvester Morin, PA-C, DMSc, Nutritionist [facility] [phone]

## 2023-11-14 NOTE — Assessment & Plan Note (Signed)
 Patient would like to continue monitoring for now. Advised he may reach out at any time for a urology referral.

## 2023-12-14 ENCOUNTER — Ambulatory Visit: Admitting: Physician Assistant
